# Patient Record
Sex: Female | Born: 1970 | Race: Black or African American | Hispanic: No | Marital: Single | Smoking: Never smoker
Health system: Southern US, Community
[De-identification: ages and names within clinical notes are randomized; demographics above are authoritative.]

## PROBLEM LIST (undated history)

## (undated) ENCOUNTER — Emergency Department (HOSPITAL_BASED_OUTPATIENT_CLINIC_OR_DEPARTMENT_OTHER): Admission: EM | Payer: Self-pay | Source: Home / Self Care

## (undated) DIAGNOSIS — J45909 Unspecified asthma, uncomplicated: Secondary | ICD-10-CM

## (undated) DIAGNOSIS — I10 Essential (primary) hypertension: Secondary | ICD-10-CM

## (undated) DIAGNOSIS — T7840XA Allergy, unspecified, initial encounter: Secondary | ICD-10-CM

## (undated) HISTORY — DX: Essential (primary) hypertension: I10

## (undated) HISTORY — DX: Unspecified asthma, uncomplicated: J45.909

## (undated) HISTORY — DX: Allergy, unspecified, initial encounter: T78.40XA

---

## 1998-12-08 ENCOUNTER — Ambulatory Visit (HOSPITAL_COMMUNITY): Admission: RE | Admit: 1998-12-08 | Discharge: 1998-12-08 | Payer: Self-pay | Admitting: Psychiatry

## 2001-06-16 ENCOUNTER — Other Ambulatory Visit: Admission: RE | Admit: 2001-06-16 | Discharge: 2001-06-16 | Payer: Self-pay | Admitting: *Deleted

## 2005-01-30 ENCOUNTER — Ambulatory Visit: Payer: Self-pay | Admitting: Internal Medicine

## 2012-07-31 ENCOUNTER — Ambulatory Visit: Payer: Managed Care, Other (non HMO)

## 2012-07-31 ENCOUNTER — Ambulatory Visit (INDEPENDENT_AMBULATORY_CARE_PROVIDER_SITE_OTHER): Payer: Managed Care, Other (non HMO) | Admitting: Emergency Medicine

## 2012-07-31 VITALS — BP 137/87 | HR 86 | Temp 98.4°F | Resp 16 | Ht 69.5 in | Wt 336.0 lb

## 2012-07-31 DIAGNOSIS — M79606 Pain in leg, unspecified: Secondary | ICD-10-CM

## 2012-07-31 DIAGNOSIS — S86919A Strain of unspecified muscle(s) and tendon(s) at lower leg level, unspecified leg, initial encounter: Secondary | ICD-10-CM

## 2012-07-31 DIAGNOSIS — M79609 Pain in unspecified limb: Secondary | ICD-10-CM

## 2012-07-31 MED ORDER — NAPROXEN SODIUM 550 MG PO TABS: 550.0000 mg | ORAL_TABLET | Freq: Two times a day (BID) | ORAL | Status: DC

## 2012-07-31 NOTE — Progress Notes (Signed)
  Date:  07/31/2012   Name:  ASHELYN MCCRAVY   DOB:  [birth year]   MRN:  [id number] Gender: female Age: 41 y.o.  PCP:  No primary provider on file.    Chief Complaint: Leg Pain   History of Present Illness:  Kathy Lane is a 41 y.o. pleasant patient who presents with the following:  Has pain in left lower leg after playing an x box game that required jumping up and down.  Has recently begun to work out.  Unsure of when her leg was injured.  Had pain for past two weeks.  No improvement with OTC meds.  Denies other complaint.  There is no problem list on file for this patient.   No past medical history on file.  No past surgical history on file.  History  Substance Use Topics  . Smoking status: Never Smoker   . Smokeless tobacco: Not on file  . Alcohol Use: Not on file    No family history on file.  Allergies  Allergen Reactions  . Penicillins     Medication list has been reviewed and updated.  Current Outpatient Prescriptions on File Prior to Visit  Medication Sig Dispense Refill  . aliskiren (TEKTURNA) 150 MG tablet Take 150 mg by mouth daily.      . montelukast (SINGULAIR) 10 MG tablet Take 10 mg by mouth at bedtime.        Review of Systems:  As per HPI, otherwise negative.    Physical Examination: Filed Vitals:   07/31/12 1810  BP: 137/87  Pulse: 86  Temp: 98.4 F (36.9 C)  Resp: 16   Filed Vitals:   07/31/12 1810  Height: 5' 9.5" (1.765 m)  Weight: 336 lb (152.409 kg)   Body mass index is 48.91 kg/(m^2). Ideal Body Weight: Weight in (lb) to have BMI = 25: 171.4    GEN: WDWN, NAD, Non-toxic, Alert & Oriented x 3 HEENT: Atraumatic, Normocephalic.  Ears and Nose: No external deformity. EXTR: No clubbing/cyanosis/edema.  No ecchymosis deformity.  Pain with forced hyper dorsiflexion of foot.  Not locally tender NEURO: Normal gait.  PSYCH: Normally interactive. Conversant. Not depressed or anxious appearing.  Calm demeanor.    Assessment and  Plan: Muscle strain lower leg Anaprox Follow up as needed  [facility] reading (PRIMARY) by  Dr. Dareen Piano. Negative .    Carmelina Dane, MD

## 2012-08-05 ENCOUNTER — Ambulatory Visit (INDEPENDENT_AMBULATORY_CARE_PROVIDER_SITE_OTHER): Payer: Managed Care, Other (non HMO) | Admitting: Physician Assistant

## 2012-08-05 ENCOUNTER — Telehealth: Payer: Self-pay

## 2012-08-05 VITALS — BP 138/92 | HR 74 | Temp 98.5°F | Resp 17 | Ht 69.5 in | Wt 336.0 lb

## 2012-08-05 DIAGNOSIS — H1012 Acute atopic conjunctivitis, left eye: Secondary | ICD-10-CM

## 2012-08-05 DIAGNOSIS — S76312A Strain of muscle, fascia and tendon of the posterior muscle group at thigh level, left thigh, initial encounter: Secondary | ICD-10-CM

## 2012-08-05 DIAGNOSIS — H1045 Other chronic allergic conjunctivitis: Secondary | ICD-10-CM

## 2012-08-05 DIAGNOSIS — IMO0002 Reserved for concepts with insufficient information to code with codable children: Secondary | ICD-10-CM

## 2012-08-05 DIAGNOSIS — J45909 Unspecified asthma, uncomplicated: Secondary | ICD-10-CM

## 2012-08-05 MED ORDER — OLOPATADINE HCL 0.1 % OP SOLN: 1.0000 [drp] | Freq: Two times a day (BID) | OPHTHALMIC | Status: DC

## 2012-08-05 MED ORDER — MONTELUKAST SODIUM 10 MG PO TABS: 10.0000 mg | ORAL_TABLET | Freq: Every day | ORAL | Status: DC

## 2012-08-05 MED ORDER — IBUPROFEN 800 MG PO TABS: 800.0000 mg | ORAL_TABLET | Freq: Three times a day (TID) | ORAL | Status: AC | PRN

## 2012-08-05 NOTE — Telephone Encounter (Signed)
PT STOPPED BY AT 102 - DID NOT WISH TO STAY - WOULD LIKE A CALL AS EARLY AS POSSIBLE. REGARDING PAIN MEDICATION NOT BEING STRONG ENOUGH

## 2012-08-05 NOTE — Telephone Encounter (Signed)
PT WAS SEEN IN OFFICE

## 2012-08-05 NOTE — Progress Notes (Signed)
  Subjective:    Patient ID: Kathy Lane, female    DOB: 14-May-1971, 41 y.o.   MRN: [id number]  HPI Pt presents to clinic for recheck.  She was seen 8/30 for L knee/leg pain which has not gotten better using naproxen.  She has been using motrin otc and that gives her more pain relief.  The pain is worse after she has been walking a few yards than when she first starts to walk.  Does not hurt in calf or knee joint.  She thinks she did this jumping with an xbox game. She has decreased her activity since her pain started.  She also needs refills on her singulair. Her asthma is well controlled on it.  Over the last couple of days she has been experiencing L eye itching and she feels like something is irritating her eye.  She has h/o seasonal allergies.   Review of Systems  Eyes: Positive for redness (mild L eye).  Musculoskeletal: Positive for gait problem. Negative for joint swelling and arthralgias.       Objective:   Physical Exam  Vitals reviewed. Constitutional: She is oriented to person, place, and time. She appears well-developed and well-nourished.  HENT:  Head: Normocephalic and atraumatic.  Right Ear: External ear normal.  Left Ear: External ear normal.  Nose: Nose normal.  Eyes: Pupils are equal, round, and reactive to light. Left eye exhibits no discharge. Left conjunctiva is injected (mild ).       Mild L upper eyelid swelling  Pulmonary/Chest: Effort normal.  Musculoskeletal:       Left knee: She exhibits normal range of motion and no swelling.       Legs:      Good knee strength without pain.  No swelling in popliteal fossa.  Neurological: She is alert and oriented to person, place, and time.  Skin: Skin is warm and dry.  Psychiatric: She has a normal mood and affect. Her behavior is normal. Judgment and thought content normal.          Assessment & Plan:   1. Left hamstring muscle strain  ibuprofen (ADVIL,MOTRIN) 800 MG tablet  2. Asthma  montelukast (SINGULAIR)  10 MG tablet  3. Allergic conjunctivitis of left eye  olopatadine (PATANOL) 0.1 % ophthalmic solution   Pt given hamstring strain exercises and illustration.  She is to use ice to the lateral attachment site. Her questions are answered.  If she develops any calf pain or swelling RTC.

## 2012-08-05 NOTE — Telephone Encounter (Signed)
Pt was seen in our office and she said she is still in pain and thought that the medication should have taken away her pain, would like a nurse to call her regarding this

## 2012-08-21 ENCOUNTER — Ambulatory Visit: Payer: Self-pay | Admitting: Family

## 2012-08-28 ENCOUNTER — Ambulatory Visit (INDEPENDENT_AMBULATORY_CARE_PROVIDER_SITE_OTHER): Payer: Self-pay | Admitting: Family

## 2012-08-28 ENCOUNTER — Encounter: Payer: Self-pay | Admitting: Family

## 2012-08-28 VITALS — BP 122/80 | HR 112 | Temp 98.0°F | Resp 16 | Wt 335.0 lb

## 2012-08-28 DIAGNOSIS — J309 Allergic rhinitis, unspecified: Secondary | ICD-10-CM

## 2012-08-28 DIAGNOSIS — I10 Essential (primary) hypertension: Secondary | ICD-10-CM

## 2012-08-28 DIAGNOSIS — J45909 Unspecified asthma, uncomplicated: Secondary | ICD-10-CM

## 2012-08-28 DIAGNOSIS — J302 Other seasonal allergic rhinitis: Secondary | ICD-10-CM

## 2012-08-28 MED ORDER — ALBUTEROL SULFATE HFA 108 (90 BASE) MCG/ACT IN AERS: 2.0000 | INHALATION_SPRAY | Freq: Four times a day (QID) | RESPIRATORY_TRACT | Status: AC | PRN

## 2012-08-28 MED ORDER — ALISKIREN FUMARATE 150 MG PO TABS: 150.0000 mg | ORAL_TABLET | Freq: Every day | ORAL | Status: DC

## 2012-08-28 NOTE — Patient Instructions (Addendum)
Please complete blood work prior to leaving. Schedule a fasting physical at your convenience. Welcome to [facility]!

## 2012-08-28 NOTE — Progress Notes (Signed)
Subjective:    Patient ID: Kathy Lane, female    DOB: 02/13/1971, 41 y.o.   MRN: [id number]  HPI  Ms.  Lane is a 41 yr old female who presents today to establish care.  Asthma-  Aggravated by cold temperature/change of weather.  She continues singulair.  Uses her inhaler about 2-3 times a month.  Hay/fever- allergies- uses singular year round.  Symptoms worst in the fall and in the spring.    HTN- uses tekturna daily. Reports that she is not sexually active.    Review of Systems  Constitutional: Negative for unexpected weight change.  HENT: Negative for hearing loss.   Eyes: Negative for visual disturbance.  Cardiovascular: Negative for chest pain and leg swelling.  Gastrointestinal: Negative for nausea, diarrhea, constipation and blood in stool.  Genitourinary: Negative for dysuria, frequency and menstrual problem.  Musculoskeletal: Negative for myalgias and arthralgias.  Neurological: Negative for headaches.  Hematological: Negative for adenopathy.  Psychiatric/Behavioral:       Denies depression/anxiety   No past medical history on file.  History   Social History  . Marital Status: Single    Spouse Name: N/A    Number of Children: N/A  . Years of Education: N/A   Occupational History  . Not on file.   Social History Main Topics  . Smoking status: Never Smoker   . Smokeless tobacco: Not on file  . Alcohol Use: Not on file  . Drug Use: Not on file  . Sexually Active: Not on file   Other Topics Concern  . 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  Family History  Problem Relation Age of Onset  . Hypertension Mother   . Hypertension Father     Allergies  Allergen Reactions  . Penicillins     Current Outpatient Prescriptions on File Prior to Visit  Medication Sig Dispense Refill  . albuterol (PROVENTIL HFA;VENTOLIN HFA) 108 (90  BASE) MCG/ACT inhaler Inhale 2 puffs into the lungs every 6 (six) hours as needed.  1 Inhaler  2  . aliskiren (TEKTURNA) 150 MG tablet Take 1 tablet (150 mg total) by mouth daily.  30 tablet  2  . montelukast (SINGULAIR) 10 MG tablet Take 1 tablet (10 mg total) by mouth at bedtime.  30 tablet  0    BP 122/80  Pulse 112  Temp 98 F (36.7 C) (Oral)  Resp 16  Wt 335 lb (151.955 kg)  SpO2 100%  LMP 07/12/2012       Objective:   Physical Exam  Constitutional: She is oriented to person, place, and time. She appears well-developed and well-nourished. No distress.       Morbidly obese AA female.   HENT:  Head: Normocephalic and atraumatic.  Cardiovascular: Normal rate and regular rhythm.   No murmur heard. Pulmonary/Chest: Effort normal and breath sounds normal. No respiratory distress. She has no wheezes. She has no rales. She exhibits no tenderness.  Abdominal: Soft. Bowel sounds are normal.  Musculoskeletal: She exhibits no edema.  Lymphadenopathy:    She has no cervical adenopathy.  Neurological: She is alert and oriented to person, place, and time.  Skin: Skin is warm and dry. No erythema.  Psychiatric: She has a normal mood and affect. Her behavior is normal. Judgment and thought content normal.          Assessment & Plan:

## 2012-08-29 LAB — BASIC METABOLIC PANEL WITH GFR
BUN: 11 mg/dL (ref 6–23)
CO2: 29 mEq/L (ref 19–32)
Calcium: 9 mg/dL (ref 8.4–10.5)
Chloride: 99 mEq/L (ref 96–112)
Creat: 0.91 mg/dL (ref 0.50–1.10)
GFR, Est African American: 89 mL/min
GFR, Est Non African American: 78 mL/min
Glucose, Bld: 132 mg/dL — ABNORMAL HIGH (ref 70–99)
Potassium: 3.7 mEq/L (ref 3.5–5.3)
Sodium: 138 mEq/L (ref 135–145)

## 2012-08-31 DIAGNOSIS — J302 Other seasonal allergic rhinitis: Secondary | ICD-10-CM | POA: Insufficient documentation

## 2012-08-31 NOTE — Assessment & Plan Note (Signed)
Currently stable on singulair and prn albuterol.

## 2012-08-31 NOTE — Assessment & Plan Note (Signed)
Advised pt- ok to take singulair or zyrtec as needed with singulair.  Symptoms currently stable.

## 2012-08-31 NOTE — Assessment & Plan Note (Signed)
BP is stable on tekturna.  Obtain BMET.  We discussed risk of birth defects should she become pregnant on tekturna and she verbalizes understanding.  She tells me that she is not sexually active.

## 2012-09-02 ENCOUNTER — Telehealth: Payer: Self-pay | Admitting: Family

## 2012-09-02 NOTE — Telephone Encounter (Signed)
Received medical records from [facility]  P: [phone] F: [phone]

## 2012-09-08 ENCOUNTER — Other Ambulatory Visit: Payer: Self-pay | Admitting: Physician Assistant

## 2012-09-09 ENCOUNTER — Other Ambulatory Visit: Payer: Self-pay | Admitting: *Deleted

## 2012-09-09 NOTE — Telephone Encounter (Signed)
Pt returned my call and left message that she is at work. Attempted to reach pt on work # and left message to return my call.

## 2012-09-09 NOTE — Telephone Encounter (Signed)
Received message from pt requesting refill of singulair to [facility] on [address]. Left message for pt to return my call. Also see lab note.

## 2012-09-10 MED ORDER — MONTELUKAST SODIUM 10 MG PO TABS: 10.0000 mg | ORAL_TABLET | Freq: Every day | ORAL | Status: AC

## 2012-09-10 NOTE — Telephone Encounter (Addendum)
Pt returned my call and left message to call her back. Refill sent to [facility].  Called [phone] and left message to call me back in the morning and ask to hold until I can get to the phone re: lab results.

## 2012-09-11 NOTE — Telephone Encounter (Signed)
Received message from pt stating I could just leave her a message re: rx and if appt is needed. Left message on [phone] at pt's request that rx had been refilled last night. Detailed lab info left and requested pt call me back to discuss need for additional blood test (hgb a1c).

## 2012-09-11 NOTE — Telephone Encounter (Signed)
Pt returned my call and was notified of rx completion and need to return for hgb a1c. Pt states she will call back to arrange appt / lab draw.

## 2012-09-30 ENCOUNTER — Telehealth: Payer: Self-pay | Admitting: *Deleted

## 2012-09-30 MED ORDER — ALISKIREN-HYDROCHLOROTHIAZIDE 150-25 MG PO TABS: 1.0000 | ORAL_TABLET | Freq: Every day | ORAL | Status: DC

## 2012-09-30 NOTE — Telephone Encounter (Signed)
Verified with pharmacy that pt has been getting tekturna HCT 150/25mg . Refill sent to [facility] x 2 refills. Left message on voicemail re: refill completion and to call if any questions. Per verbal from Provider, pt should schedule a fasting physical around January.  Please call pt to arrange appt.

## 2012-09-30 NOTE — Telephone Encounter (Signed)
Received message from pt stating she takes Tekturna HCT 150/25. Current med list indicates tekturna 150mg . Pt is requesting refill of tekturna hct.  Please advise.

## 2012-09-30 NOTE — Telephone Encounter (Signed)
Please verify with pharmacy. If last fill was Tekturna HCT ok to refill 150/25 #30 with 2 refills.

## 2012-10-01 NOTE — Telephone Encounter (Signed)
Left message for patient to return my call.

## 2012-10-06 NOTE — Telephone Encounter (Signed)
Left message for patient to return my call.

## 2012-10-08 NOTE — Telephone Encounter (Signed)
Left message for patient to return my call.

## 2012-10-08 NOTE — Telephone Encounter (Signed)
Letter mailed to pt.  

## 2012-10-28 ENCOUNTER — Other Ambulatory Visit: Payer: Self-pay | Admitting: *Deleted

## 2012-10-28 MED ORDER — ALISKIREN-HYDROCHLOROTHIAZIDE 150-25 MG PO TABS: 1.0000 | ORAL_TABLET | Freq: Every day | ORAL | Status: DC

## 2012-10-28 NOTE — Telephone Encounter (Signed)
Received fax from [facility] requesting a 90 day supply of Tekturna HCT as "retail refill limit exceeded". Rx sent.

## 2013-01-12 ENCOUNTER — Encounter: Payer: Self-pay | Admitting: Family

## 2013-01-12 ENCOUNTER — Ambulatory Visit (INDEPENDENT_AMBULATORY_CARE_PROVIDER_SITE_OTHER): Payer: Managed Care, Other (non HMO) | Admitting: Family

## 2013-01-12 ENCOUNTER — Ambulatory Visit: Payer: Self-pay | Admitting: Family

## 2013-01-12 VITALS — BP 130/78 | HR 81 | Temp 98.4°F | Resp 16 | Wt 323.1 lb

## 2013-01-12 DIAGNOSIS — J309 Allergic rhinitis, unspecified: Secondary | ICD-10-CM

## 2013-01-12 DIAGNOSIS — J302 Other seasonal allergic rhinitis: Secondary | ICD-10-CM

## 2013-01-12 DIAGNOSIS — Z0000 Encounter for general adult medical examination without abnormal findings: Secondary | ICD-10-CM

## 2013-01-12 DIAGNOSIS — J45909 Unspecified asthma, uncomplicated: Secondary | ICD-10-CM

## 2013-01-12 DIAGNOSIS — I10 Essential (primary) hypertension: Secondary | ICD-10-CM

## 2013-01-12 MED ORDER — ALISKIREN-HYDROCHLOROTHIAZIDE 150-25 MG PO TABS: 1.0000 | ORAL_TABLET | Freq: Every day | ORAL | Status: AC

## 2013-01-12 NOTE — Assessment & Plan Note (Signed)
Stable

## 2013-01-12 NOTE — Patient Instructions (Addendum)
Please complete lab work prior to leaving. Schedule a physical at the front dek.

## 2013-01-12 NOTE — Assessment & Plan Note (Signed)
Stable on singulair and albuterol.  

## 2013-01-12 NOTE — Progress Notes (Signed)
  Subjective:    Patient ID: Kathy Lane, female    DOB: 1970-12-13, 42 y.o.   MRN: [id number]  HPI  Asthma- She continues singulair.  Reports that she has been using albuterol 2-3 times a month.  Allergies- reports that her allergies are ok.   HTN- She continues tekturna hct. She denies sob, chest pain or swelling  Review of Systems See HPI  No past medical history on file.  History   Social History  . Marital Status: Single    Spouse Name: N/A    Number of Children: N/A  . Years of Education: N/A   Occupational History  . Not on file.   Social History Main Topics  . Smoking status: Never Smoker   . Smokeless tobacco: Not on file  . Alcohol Use: Not on file  . Drug Use: Not on file  . Sexually Active: Not on file   Other Topics Concern  . 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  Family History  Problem Relation Age of Onset  . Hypertension Mother   . Hypertension Father     Allergies  Allergen Reactions  . Penicillins     Current Outpatient Prescriptions on File Prior to Visit  Medication Sig Dispense Refill  . albuterol (PROVENTIL HFA;VENTOLIN HFA) 108 (90 BASE) MCG/ACT inhaler Inhale 2 puffs into the lungs every 6 (six) hours as needed.  1 Inhaler  2  . Aliskiren-Hydrochlorothiazide (TEKTURNA HCT) 150-25 MG TABS Take 1 tablet by mouth daily.  90 each  0  . montelukast (SINGULAIR) 10 MG tablet Take 1 tablet (10 mg total) by mouth at bedtime.  30 tablet  3   No current facility-administered medications on file prior to visit.    BP 140/84  Pulse 81  Temp(Src) 98.4 F (36.9 C) (Oral)  Resp 16  Wt 323 lb 1.9 oz (146.566 kg)  BMI 47.05 kg/m2  SpO2 98%  LMP 01/12/2013       Objective:   Physical Exam  Constitutional: She appears well-developed and well-nourished. No distress.  Cardiovascular: Normal  rate and regular rhythm.   No murmur heard. Pulmonary/Chest: Effort normal and breath sounds normal. No respiratory distress. She has no wheezes. She has no rales. She exhibits no tenderness.  Musculoskeletal: She exhibits no edema.  Psychiatric: She has a normal mood and affect. Her behavior is normal. Judgment and thought content normal.          Assessment & Plan:

## 2013-01-12 NOTE — Assessment & Plan Note (Signed)
BP stable on current meds.   

## 2013-01-13 ENCOUNTER — Encounter: Payer: Self-pay | Admitting: Family

## 2013-01-13 DIAGNOSIS — E785 Hyperlipidemia, unspecified: Secondary | ICD-10-CM | POA: Insufficient documentation

## 2013-01-13 LAB — LIPID PANEL
Cholesterol: 230 mg/dL — ABNORMAL HIGH (ref 0–200)
LDL Cholesterol: 158 mg/dL — ABNORMAL HIGH (ref 0–99)
VLDL: 11 mg/dL (ref 0–40)

## 2013-01-18 ENCOUNTER — Telehealth: Payer: Self-pay | Admitting: *Deleted

## 2013-01-18 NOTE — Telephone Encounter (Signed)
Wellness form completed and faxed to [facility] @ [phone].

## 2014-12-29 ENCOUNTER — Ambulatory Visit (INDEPENDENT_AMBULATORY_CARE_PROVIDER_SITE_OTHER): Payer: Managed Care, Other (non HMO) | Admitting: Sports Medicine

## 2014-12-29 VITALS — BP 146/87 | HR 89 | Temp 98.3°F | Resp 18 | Ht 69.0 in | Wt 326.0 lb

## 2014-12-29 DIAGNOSIS — M722 Plantar fascial fibromatosis: Secondary | ICD-10-CM

## 2014-12-29 DIAGNOSIS — M25561 Pain in right knee: Secondary | ICD-10-CM

## 2014-12-29 NOTE — Patient Instructions (Addendum)
It was nice to meet you today.. You can try over-the-counter gel heel cups. Remember to do the exercises as below as well as straight leg raises to help increase your quad strength.  willing to try

## 2014-12-29 NOTE — Progress Notes (Signed)
  Kathy Lane - 44 y.o. female MRN [id number]  Date of birth: 1971-06-04  SUBJECTIVE:   CC: right heel pain HPI: 2 months of worsening right heel pain worse first thing in the morning. She has had progressive difficulty with this. Denies any numbness or tingling. No known injury. Mild issues with this on left side in the past but self resolved.  ROS: per HPI.   HISTORY:  Past Medical, Surgical, Social, and Family History reviewed & updated per EMR.  Pertinent Historical Findings include:  reports that she has never smoked. She does not have any smokeless tobacco history on file. Obesity, asthma, hypertension Nonsmoker   OBJECTIVE:  VS:   HT:5\' 9"  (175.3 cm)   WT:(!) 326 lb (147.873 kg)  BMI:48.2          BP:(!) 146/87 mmHg  HR:89bpm  TEMP:98.3 F (36.8 C)(Oral)  RESP:100 %  PHYSICAL EXAM:  Adult obese African American female in no acute distress she is alert and appropriately interactive. Moderate insight. Lower extremity DP and PT pulses 2+/4. No significant pretibial edema. Bilateral foot exam: Overall normal-appearing, well aligned. No significant deformity. Marked tenderness palpation over lateral plantar fascia origin. No pain with calcaneal squeeze. No retrocalcaneal pain. No Achilles pain. Strength 5/5 in dorsiflexion, plantar flexion, inversion, eversion. Range of motion dorsiflexion to 90 on left, 80 on right. 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 Trace patellar grind. No significant effusion. Stable to varus and valgus strain anterior posterior drawer. Negative McMurray's. No medial or lateral joint line tenderness. Extensor mechanism intact.   ASSESSMENT: 1. Right knee pain   2. Plantar fasciitis, right    PLAN: See problem based charting & AVS for additional documentation. >50% of this 30 minute visit spent in direct patient counseling and/or coordination of care. Extensive time spent in discussing home exercise program including VMO  strengthening and eccentric heel raises. Alfredson exercises. Discussed using anti-inflammatories and potential injection for plantar fascial but patient is willing to try conservative treatment first. > Return if symptoms worsen or fail to improve.

## 2014-12-30 NOTE — Progress Notes (Deleted)
Foot Pain  Review of Systems Physical Exam

## 2015-05-11 ENCOUNTER — Telehealth: Payer: Self-pay

## 2015-05-11 NOTE — Telephone Encounter (Signed)
Refill Request.  

## 2015-12-17 ENCOUNTER — Ambulatory Visit (INDEPENDENT_AMBULATORY_CARE_PROVIDER_SITE_OTHER): Payer: Managed Care, Other (non HMO) | Admitting: Family Medicine

## 2015-12-17 VITALS — BP 140/80 | HR 103 | Temp 98.4°F | Resp 20 | Ht 70.0 in | Wt 335.0 lb

## 2015-12-17 DIAGNOSIS — J4521 Mild intermittent asthma with (acute) exacerbation: Secondary | ICD-10-CM | POA: Diagnosis not present

## 2015-12-17 DIAGNOSIS — J069 Acute upper respiratory infection, unspecified: Secondary | ICD-10-CM

## 2015-12-17 MED ORDER — MUCINEX DM MAXIMUM STRENGTH 60-1200 MG PO TB12: 1.0000 | ORAL_TABLET | Freq: Two times a day (BID) | ORAL | Status: AC

## 2015-12-17 MED ORDER — BECLOMETHASONE DIPROPIONATE 80 MCG/ACT IN AERS: 2.0000 | INHALATION_SPRAY | Freq: Two times a day (BID) | RESPIRATORY_TRACT | Status: AC

## 2015-12-17 MED ORDER — HYDROCOD POLST-CPM POLST ER 10-8 MG/5ML PO SUER: 5.0000 mL | Freq: Every evening | ORAL | Status: AC | PRN

## 2015-12-17 MED ORDER — IPRATROPIUM BROMIDE 0.03 % NA SOLN: 2.0000 | Freq: Four times a day (QID) | NASAL | Status: AC

## 2015-12-17 MED ORDER — FLUTICASONE PROPIONATE 50 MCG/ACT NA SUSP: 2.0000 | Freq: Every day | NASAL | Status: AC

## 2015-12-17 MED ORDER — BENZONATATE 200 MG PO CAPS: 200.0000 mg | ORAL_CAPSULE | Freq: Three times a day (TID) | ORAL | Status: AC | PRN

## 2015-12-17 NOTE — Patient Instructions (Addendum)
Step up your albuterol inhaler to 4 to 6 times a day while ill.  Add in some qvar twice a day during the illness to - hopefully we can avoid putting you on system steroids by using inhaled and nasal spray steroids instead. Add in nasal saline spray and or atrovent nasal spray during the day. Start mucinex DM twice a day.  If cough continues, then can try tessalon during the day or a teaspoon of the tussionex at night.   Asthma Attack Prevention While you may not be able to control the fact that you have asthma, you can take actions to prevent asthma attacks. The best way to prevent asthma attacks is to maintain good control of your asthma. You can achieve this by:  Taking your medicines as directed.  Avoiding things that can irritate your airways or make your asthma symptoms worse (asthma triggers).  Keeping track of how well your asthma is controlled and of any changes in your symptoms.  Responding quickly to worsening asthma symptoms (asthma attack).  Seeking emergency care when it is needed. WHAT ARE SOME WAYS TO PREVENT AN ASTHMA ATTACK? Have a Plan Work with your health care provider to create a written plan for managing and treating your asthma attacks (asthma action plan). This plan includes:  A list of your asthma triggers and how you can avoid them.  Information on when medicines should be taken and when their dosages should be changed.  The use of a device that measures how well your lungs are working (peak flow meter). Monitor Your Asthma Use your peak flow meter and record your results in a journal every day. A drop in your peak flow numbers on one or more days may indicate the start of an asthma attack. This can happen even before you start to feel symptoms. You can prevent an asthma attack from getting worse by following the steps in your asthma action plan. Avoid Asthma Triggers Work with your asthma health care provider to find out what your asthma triggers are. This can  be done by:  Allergy testing.  Keeping a journal that notes when asthma attacks occur and the factors that may have contributed to them.  Determining if there are other medical conditions that are making your asthma worse. Once you have determined your asthma triggers, take steps to avoid them. This may include avoiding excessive or prolonged exposure to:  Dust. Have someone dust and vacuum your home for you once or twice a week. Using a high-efficiency particulate arrestance (HEPA) vacuum is best.  Smoke. This includes campfire smoke, forest fire smoke, and secondhand smoke from tobacco products.  Pet dander. Avoid contact with animals that you know you are allergic to.  Allergens from trees, grasses or pollens. Avoid spending a lot of time outdoors when pollen counts are high, and on very windy days.  Very cold, dry, or humid air.  Mold.  Foods that contain high amounts of sulfites.  Strong odors.  Outdoor air pollutants, such as Museum/gallery exhibitions officer.  Indoor air pollutants, such as aerosol sprays and fumes from household cleaners.  Household pests, including dust mites and cockroaches, and pest droppings.  Certain medicines, including NSAIDs. Always talk to your health care provider before stopping or starting any new medicines. Medicines Take over-the-counter and prescription medicines only as told by your health care provider. Many asthma attacks can be prevented by carefully following your medicine schedule. Taking your medicines correctly is especially important when you cannot avoid certain asthma  triggers. Act Quickly If an asthma attack does happen, acting quickly can decrease how severe it is and how long it lasts. Take these steps:   Pay attention to your symptoms. If you are coughing, wheezing, or having difficulty breathing, do not wait to see if your symptoms go away on their own. Follow your asthma action plan.  If you have followed your asthma action plan and your  symptoms are not improving, call your health care provider or seek immediate medical care at the nearest hospital. It is important to note how often you need to use your fast-acting rescue inhaler. If you are using your rescue inhaler more often, it may mean that your asthma is not under control. Adjusting your asthma treatment plan may help you to prevent future asthma attacks and help you to gain better control of your condition. HOW CAN I PREVENT AN ASTHMA ATTACK WHEN I EXERCISE? Follow advice from your health care provider about whether you should use your fast-acting inhaler before exercising. Many people with asthma experience exercise-induced bronchoconstriction (EIB). This condition often worsens during vigorous exercise in cold, humid, or dry environments. Usually, people with EIB can stay very active by pre-treating with a fast-acting inhaler before exercising.   This information is not intended to replace advice given to you by your health care provider. Make sure you discuss any questions you have with your health care provider.   Document Released: 11/06/2009 Document Revised: 08/09/2015 Document Reviewed: 04/20/2015 Elsevier Interactive Patient Education [employer]2016 [employer] Inc.

## 2015-12-17 NOTE — Progress Notes (Addendum)
Subjective:  By signing my name below, I, Kathy Lane, attest that this documentation has been prepared under the direction and in the presence of Kathy SorensonEva Isham Smitherman, MD.  Electronically Signed: Andrew Auaven Lane, ED Scribe. 12/17/2015. 2:12 PM.   Patient ID: Kathy Lane, female    DOB: [birth year], 45 y.o.   MRN: [id number]  HPI Chief Complaint  Patient presents with  . Other    Chest congestion , x 4 days  . Cough  . Dizziness  . Shortness of Breath  . Chills   HPI Comments: Kathy Lane is a 45 y.o. female who presents to the [facility] complaining of cough and SOB. Pt receives weekly allergy shots, which sometime makes her feel ill afterwards. She reports associated chest congestion, wheezes,  chills, nasal congestion, sinus pressure. She has been using albuterol twice a day, zyrtec, flonase, Singulair. She has not been sleeping well due to symptoms. She denies fever and sore throat.  Past Medical History  Diagnosis Date  . Allergy   . Asthma   . Hypertension    History reviewed. No pertinent past surgical history. Prior to Admission medications   Medication Sig Start Date End Date Taking? Authorizing Provider  albuterol (PROVENTIL HFA;VENTOLIN HFA) 108 (90 BASE) MCG/ACT inhaler Inhale 2 puffs into the lungs every 6 (six) hours as needed. 08/28/12  Yes Sandford CrazeMelissa O'Sullivan, NP  Aliskiren-Hydrochlorothiazide (TEKTURNA HCT) 150-25 MG TABS Take 1 tablet by mouth daily. 01/12/13  Yes Sandford CrazeMelissa O'Sullivan, NP  cetirizine (ZYRTEC) 10 MG tablet Take 10 mg by mouth daily.   Yes Historical Provider, MD  montelukast (SINGULAIR) 10 MG tablet Take 1 tablet (10 mg total) by mouth at bedtime. 09/10/12  Yes Sandford CrazeMelissa O'Sullivan, NP   Review of Systems  Constitutional: Positive for chills. Negative for fever.  HENT: Positive for congestion and sinus pressure. Negative for sore throat.   Respiratory: Positive for cough, shortness of breath and wheezing.   Cardiovascular: Negative for chest  pain and palpitations.  Gastrointestinal: Negative for nausea and vomiting.  Genitourinary: Negative for urgency and decreased urine volume.  Psychiatric/Behavioral: Positive for sleep disturbance.    Objective:   Physical Exam  Constitutional: She is oriented to person, place, and time. She appears well-developed and well-nourished. No distress.  HENT:  Head: Normocephalic and atraumatic.  Nose: Mucosal edema present.  Mouth/Throat: Posterior oropharyngeal erythema present.  Eyes: Conjunctivae and EOM are normal.  Neck: Neck supple.  Cardiovascular: Normal rate.   Pulmonary/Chest: Effort normal.  Musculoskeletal: Normal range of motion.  Lymphadenopathy:       Head (right side): Tonsillar adenopathy present.       Head (left side): Tonsillar adenopathy present.    She has no cervical adenopathy.  Neurological: She is alert and oriented to person, place, and time.  Skin: Skin is warm and dry.  Psychiatric: She has a normal mood and affect. Her behavior is normal.  Nursing note and vitals reviewed.  Filed Vitals:   12/17/15 1404  BP: 140/80  Pulse: 103  Temp: 98.4 F (36.9 C)  TempSrc: Oral  Resp: 20  Height: 5\' 10"  (1.778 m)  Weight: 335 lb (151.955 kg)  SpO2: 97%    Assessment & Plan:   1. Acute upper respiratory infection   2. Hay fever with asthma, mild intermittent, with acute exacerbation   Would like to avoid using systemic steroids if possible - no signs of bacterial infection on exam and asthma flair is still mild so rec starting  qvar inh steroid in addition to flonase during illness. Due to medical co-morbidities, avoid otc decongestants - try atrovent instead.  Meds ordered this encounter  Medications  . fluticasone (FLONASE) 50 MCG/ACT nasal spray    Sig: Place 2 sprays into both nostrils at bedtime.    Dispense:  16 g    Refill:  0  . ipratropium (ATROVENT) 0.03 % nasal spray    Sig: Place 2 sprays into the nose 4 (four) times daily.    Dispense:  30 mL     Refill:  1  . beclomethasone (QVAR) 80 MCG/ACT inhaler    Sig: Inhale 2 puffs into the lungs 2 (two) times daily.    Dispense:  1 Inhaler    Refill:  0  . benzonatate (TESSALON) 200 MG capsule    Sig: Take 1 capsule (200 mg total) by mouth 3 (three) times daily as needed for cough.    Dispense:  40 capsule    Refill:  0  . chlorpheniramine-HYDROcodone (TUSSIONEX PENNKINETIC ER) 10-8 MG/5ML SUER    Sig: Take 5 mLs by mouth at bedtime as needed.    Dispense:  60 mL    Refill:  0  . Dextromethorphan-Guaifenesin (MUCINEX DM MAXIMUM STRENGTH) 60-1200 MG TB12    Sig: Take 1 tablet by mouth every 12 (twelve) hours.    Dispense:  14 each    Refill:  1   I personally performed the services described in this documentation, which was scribed in my presence. The recorded information has been reviewed and considered, and addended by me as needed.  Kathy Sorenson, MD MPH

## 2016-03-12 ENCOUNTER — Ambulatory Visit (INDEPENDENT_AMBULATORY_CARE_PROVIDER_SITE_OTHER): Payer: Managed Care, Other (non HMO)

## 2016-03-12 ENCOUNTER — Ambulatory Visit (INDEPENDENT_AMBULATORY_CARE_PROVIDER_SITE_OTHER): Payer: Managed Care, Other (non HMO) | Admitting: Family Medicine

## 2016-03-12 VITALS — BP 138/92 | HR 90 | Temp 98.1°F | Resp 18 | Ht 67.0 in | Wt 330.0 lb

## 2016-03-12 DIAGNOSIS — M79671 Pain in right foot: Secondary | ICD-10-CM | POA: Diagnosis not present

## 2016-03-12 DIAGNOSIS — M6588 Other synovitis and tenosynovitis, other site: Secondary | ICD-10-CM | POA: Diagnosis not present

## 2016-03-12 DIAGNOSIS — M7750 Other enthesopathy of unspecified foot: Secondary | ICD-10-CM

## 2016-03-12 MED ORDER — DICLOFENAC SODIUM 75 MG PO TBEC: 75.0000 mg | DELAYED_RELEASE_TABLET | Freq: Two times a day (BID) | ORAL | Status: AC

## 2016-03-12 NOTE — Patient Instructions (Addendum)
Take diclofenac one pill twice daily at breakfast and supper  Try to avoid stressing the foot and ankle, avoiding prolonged standing or walking. Recommend comfortable sneakers and not wearing high heels.  Apply ice for about 15 minutes for 4- 5 times daily  If not improving significantly over the next week contact us and referral will be made to sports medicine for further evaluation    IF you received an x-ray today, you will receive an invoice from [facility]. Please contact [facility] at [phone] with questions or concerns regarding your invoice.   IF you received labwork today, you will receive an invoice from [facility]. Please contact [facility] at [phone] with questions or concerns regarding your invoice.   Our billing staff will not be able to assist you with questions regarding bills from these companies.  You will be contacted with the lab results as soon as they are available. The fastest way to get your results is to activate your My Chart account. Instructions are located on the last page of this paperwork. If you have not heard from us regarding the results in 2 weeks, please contact this office.

## 2016-03-12 NOTE — Progress Notes (Signed)
Patient ID: Kathy Lane, female    DOB: 09-06-1971  Age: 45 y.o. MRN: [id number]  Chief Complaint  Patient presents with  . Foot Pain    Right    Subjective:   45 year old lady who had done some work in her patio digging and putting some rocks in. She developed pain in the right foot, dorsum of the foot. No specific point in time injury but it hurts there. It hurts more when she is on it. When she is off it doesn't seem to hurt that much. Has not had this same problem in the past. She has had plantar fasciitis in the past.  Current allergies, medications, problem list, past/family and social histories reviewed.  Objective:  BP 138/92 mmHg  Pulse 90  Temp(Src) 98.1 F (36.7 C) (Oral)  Resp 18  Ht  (1.702 m)  Wt 330 lb (149.687 kg)  BMI 51.67 kg/m2  SpO2 98%  LMP 01/31/2016  No major acute distress. She is overweight. No significant edema. Good range of motion of the ankle. With inversion of the ankle there is pain laterally in the proximal part of the foot. She is tender to touch anterior to the lateral malleolus and down toward the calcaneus.  Assessment & Plan:   Assessment: 1. Foot pain, right   2. Tendonitis of ankle or foot       Plan: Will x-ray  Orders Placed This Encounter  Procedures  . DG Foot Complete Right    Order Specific Question:  Reason for Exam (SYMPTOM  OR DIAGNOSIS REQUIRED)    Answer:  foot pain lateral proximal area    Order Specific Question:  Is the patient pregnant?    Answer:  No    Order Specific Question:  Preferred imaging location?    Answer:  External    Meds ordered this encounter  Medications  . diclofenac (VOLTAREN) 75 MG EC tablet    Sig: Take 1 tablet (75 mg total) by mouth 2 (two) times daily.    Dispense:  30 tablet    Refill:  0   X-rays examined. No fracture noted. Radiology result is still pending.  Discussed long term need to watch her weight because of stress on feet.    Patient Instructions   Take  diclofenac one pill twice daily at breakfast and supper  Try to avoid stressing the foot and ankle, avoiding prolonged standing or walking. Recommend comfortable sneakers and not wearing high heels.  Apply ice for about 15 minutes for 4- 5 times daily  If not improving significantly over the next week contact us and referral will be made to sports medicine for further evaluation    IF you received an x-ray today, you will receive an invoice from [facility]. Please contact [facility] at [phone] with questions or concerns regarding your invoice.   IF you received labwork today, you will receive an invoice from [facility]. Please contact [facility] at [phone] with questions or concerns regarding your invoice.   Our billing staff will not be able to assist you with questions regarding bills from these companies.  You will be contacted with the lab results as soon as they are available. The fastest way to get your results is to activate your My Chart account. Instructions are located on the last page of this paperwork. If you have not heard from [place] regarding the results in 2 weeks, please contact this office.         No  Follow-up on file.   HOPPER,DAVID, MD 03/12/2016

## 2017-01-04 IMAGING — CR DG FOOT COMPLETE 3+V*R*
3 series · 3 of 3 positions shown · non-contrast
Comparison: None.

CLINICAL DATA: Right foot pain, no known injury

EXAM:
RIGHT FOOT COMPLETE - 3+ VIEW

[AP]
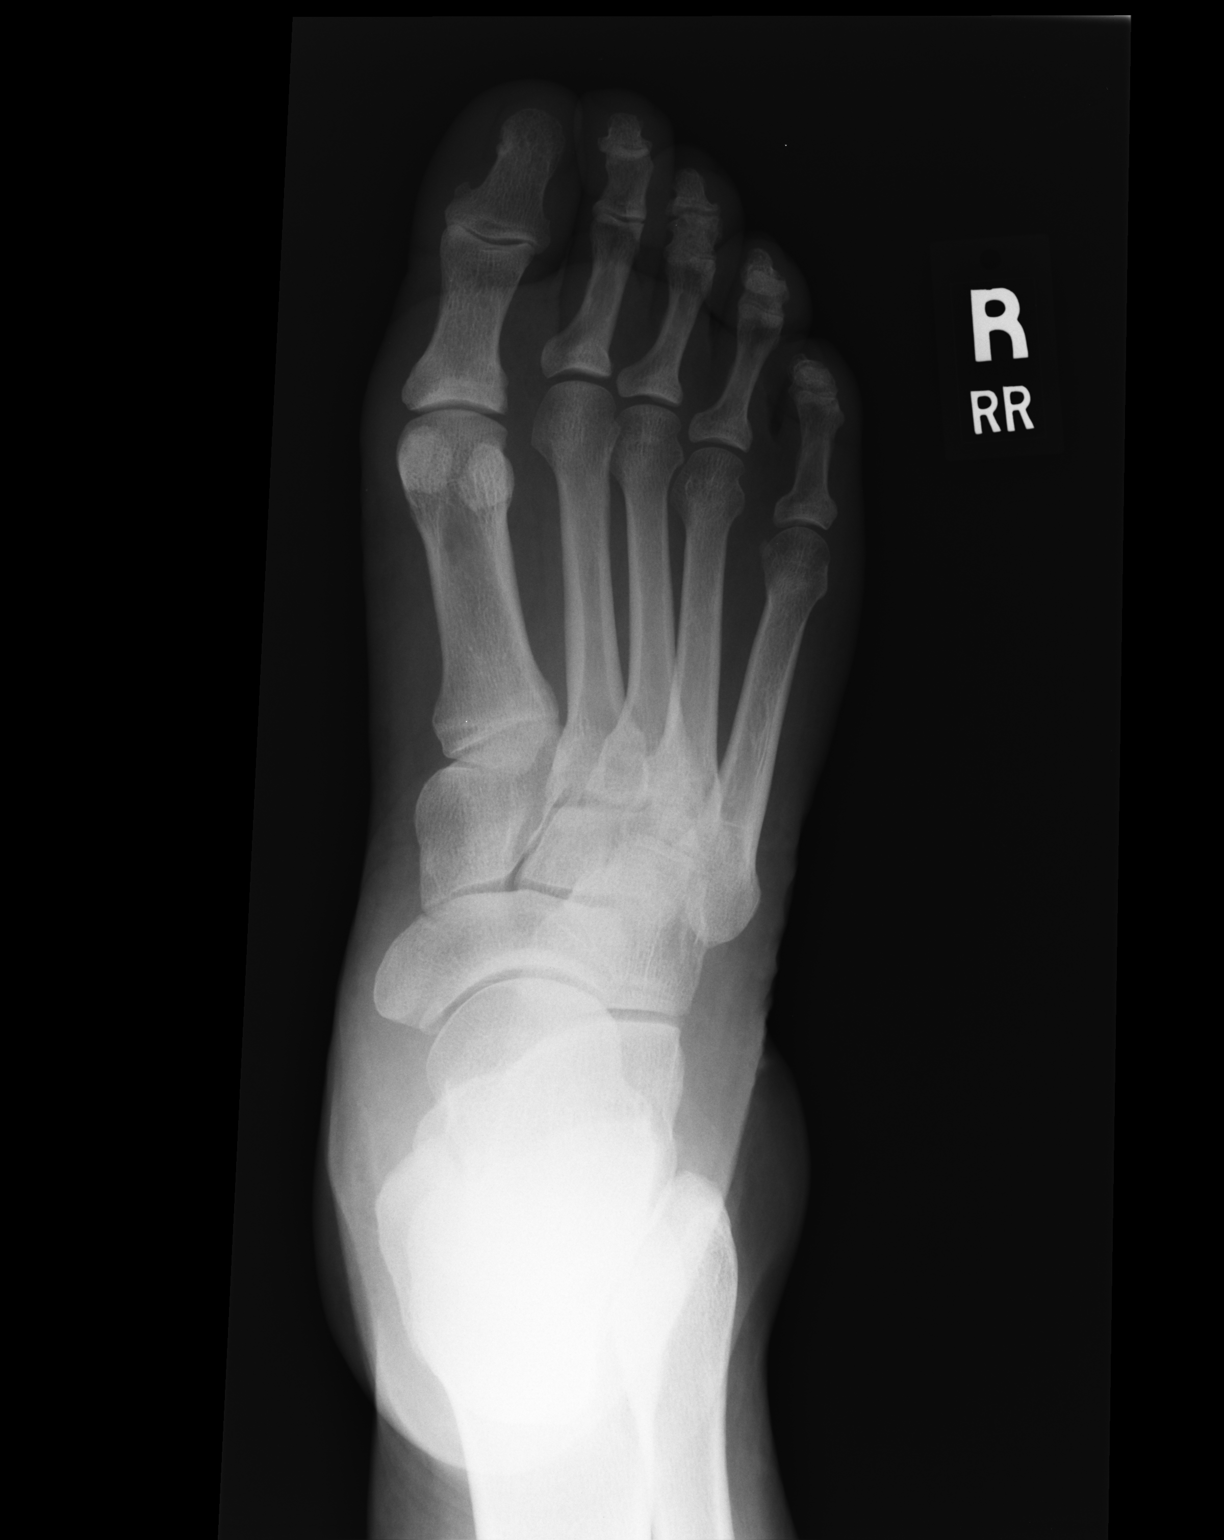

[ap obl int rot]
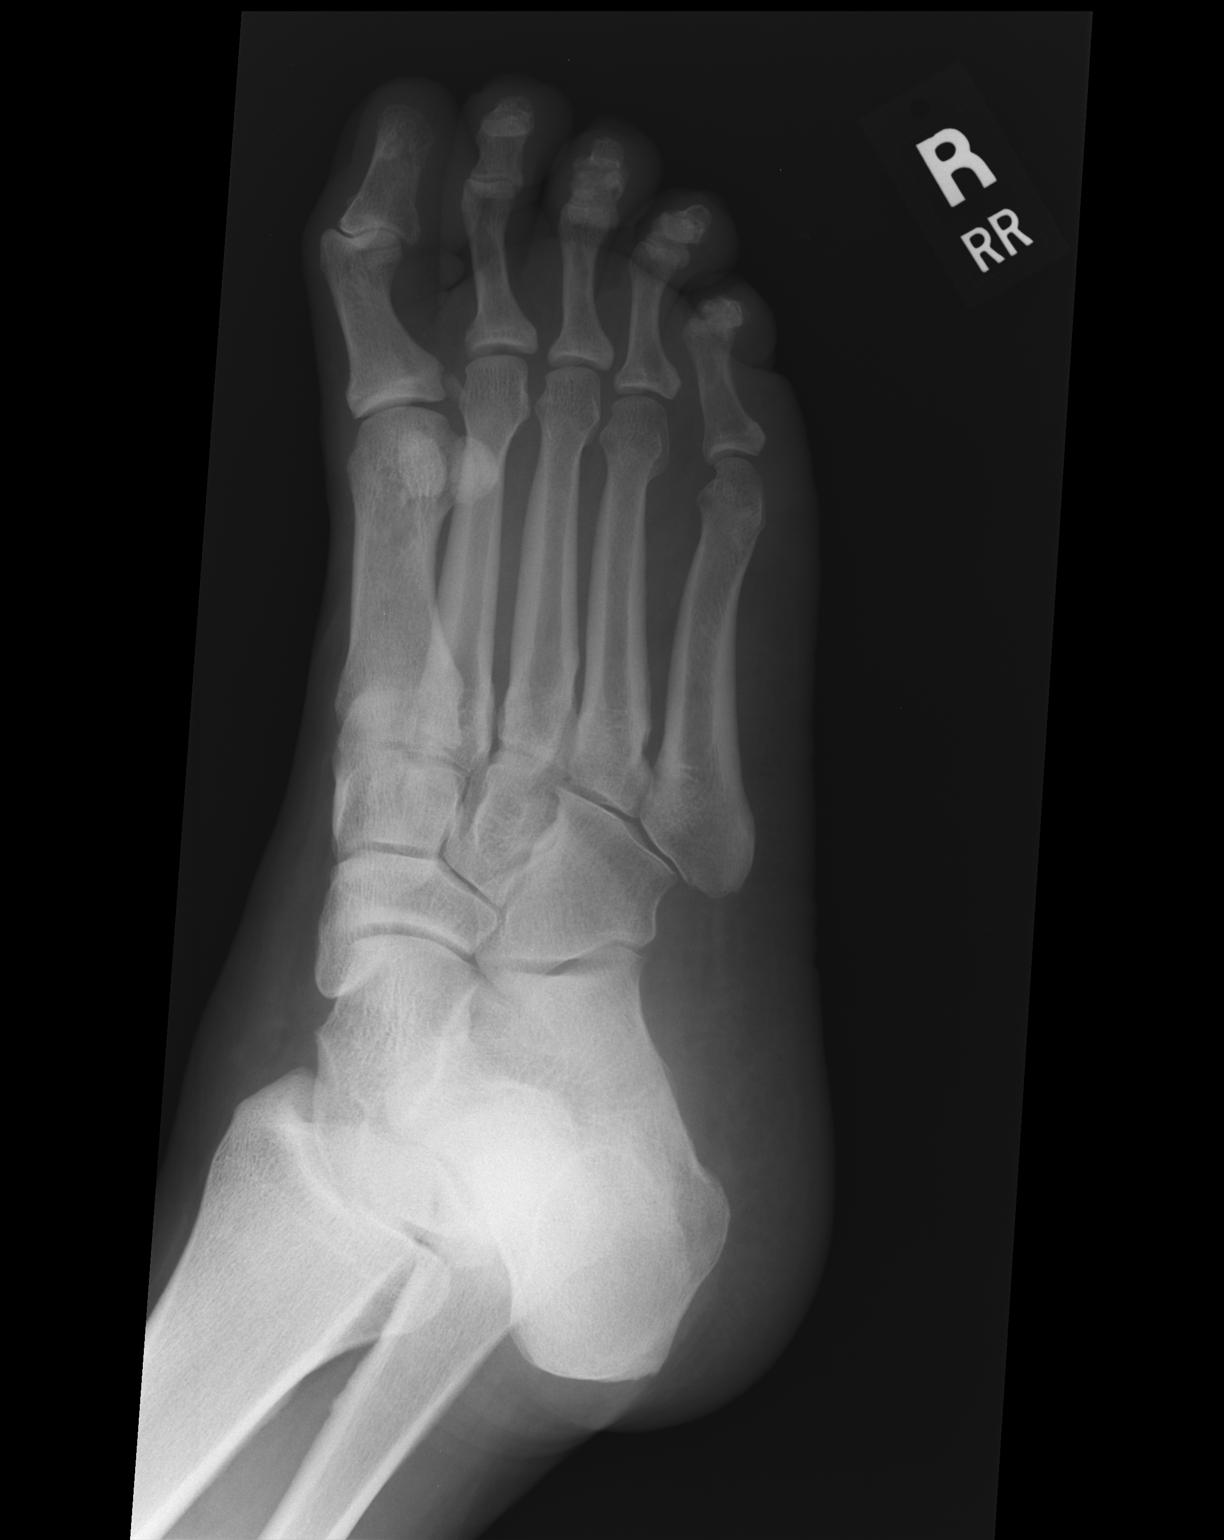

[lateral]
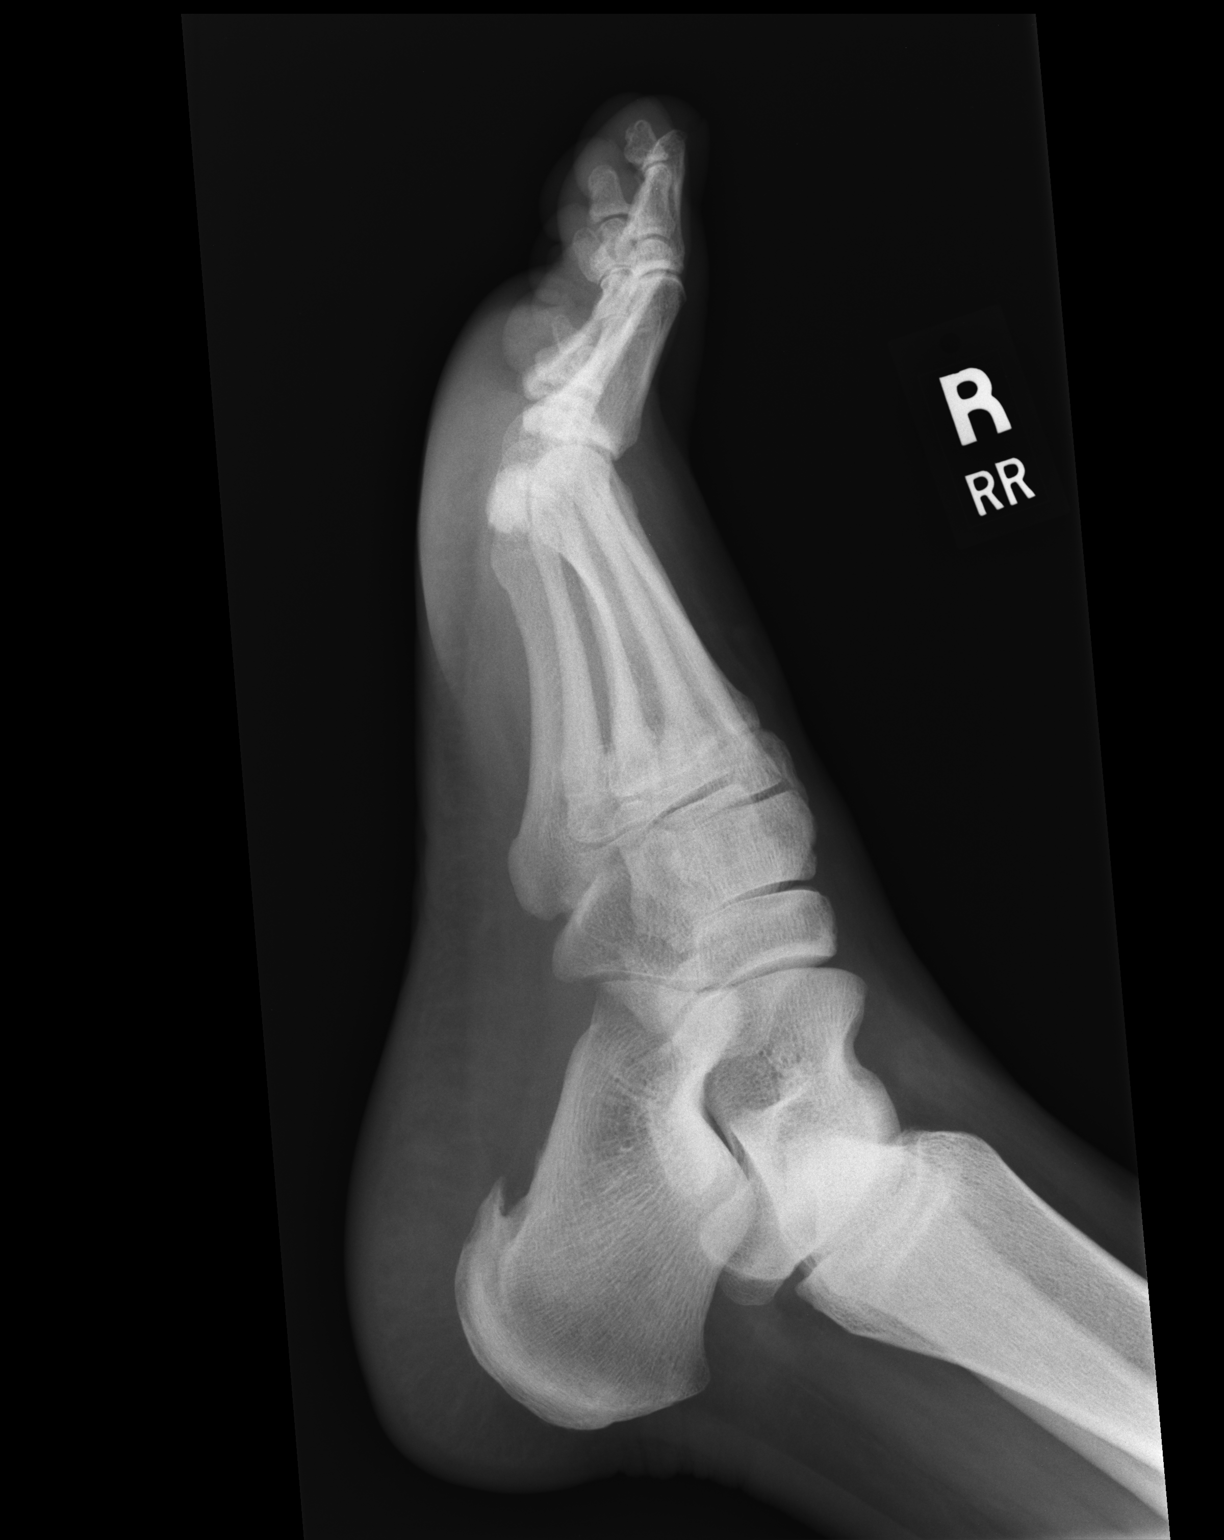

[3 of 3 positions shown; findings below may reference images not displayed]

FINDINGS: Three views of the right foot submitted. No acute fracture or
subluxation. There is plantar spur of calcaneus.
IMPRESSION: No acute fracture or subluxation.  Plantar spur of calcaneus.

## 2020-01-28 ENCOUNTER — Ambulatory Visit: Payer: Managed Care, Other (non HMO) | Attending: Internal Medicine

## 2020-01-28 DIAGNOSIS — Z23 Encounter for immunization: Secondary | ICD-10-CM

## 2020-01-28 NOTE — Progress Notes (Signed)
   Covid-19 Vaccination Clinic  Name:  Kathy Lane    MRN: [id number] DOB: 1971/02/09  01/28/2020  Kathy Lane was observed post Covid-19 immunization for 15 minutes without incidence. She was provided with Vaccine Information Sheet and instruction to access the V-Safe system.   Kathy Lane was instructed to call 911 with any severe reactions post vaccine: Marland Kitchen Difficulty breathing  . Swelling of your face and throat  . A fast heartbeat  . A bad rash all over your body  . Dizziness and weakness    Immunizations Administered    Name Date Dose VIS Date Route   Pfizer COVID-19 Vaccine 01/28/2020 10:10 AM 0.3 mL 11/12/2019 Intramuscular   Manufacturer: [employer], [employer]   Lot: [id number]   NDC: [address]-1

## 2020-02-23 ENCOUNTER — Ambulatory Visit: Payer: Managed Care, Other (non HMO) | Attending: Internal Medicine

## 2020-02-23 DIAGNOSIS — Z23 Encounter for immunization: Secondary | ICD-10-CM

## 2020-02-23 NOTE — Progress Notes (Signed)
   Covid-19 Vaccination Clinic  Name:  VERENISE MOULIN    MRN: [id number] DOB: [birth year]  02/23/2020  Ms. Burkman was observed post Covid-19 immunization for 15 minutes without incident. She was provided with Vaccine Information Sheet and instruction to access the V-Safe system.   Ms. Berch was instructed to call 911 with any severe reactions post vaccine: Marland Kitchen Difficulty breathing  . Swelling of face and throat  . A fast heartbeat  . A bad rash all over body  . Dizziness and weakness   Immunizations Administered    Name Date Dose VIS Date Route   Pfizer COVID-19 Vaccine 02/23/2020  4:00 PM 0.3 mL 11/12/2019 Intramuscular   Manufacturer: [employer], [employer]   Lot: [id number]   NDC: [address]-7

## 2022-07-11 ENCOUNTER — Ambulatory Visit: Payer: BC Managed Care – PPO | Admitting: Orthopedic Surgery

## 2022-07-11 ENCOUNTER — Encounter: Payer: Self-pay | Admitting: Orthopedic Surgery

## 2022-07-11 ENCOUNTER — Ambulatory Visit (INDEPENDENT_AMBULATORY_CARE_PROVIDER_SITE_OTHER): Payer: BC Managed Care – PPO

## 2022-07-11 DIAGNOSIS — M79672 Pain in left foot: Secondary | ICD-10-CM

## 2022-07-11 DIAGNOSIS — M25561 Pain in right knee: Secondary | ICD-10-CM | POA: Diagnosis not present

## 2022-07-11 NOTE — Progress Notes (Signed)
Office Visit Note   Patient: Kathy Lane           Date of Birth: 1971/08/16           MRN: [id number] Visit Date: 07/11/2022              Requested by: No referring provider defined for this encounter. PCP: Patient, No Pcp Per  Chief Complaint  Patient presents with   Left Foot - Pain      HPI: Patient is a 51 year old woman who presents complaining of bunion pain left foot.  Patient states she has also had some right knee pain as well.  Assessment & Plan: Visit Diagnoses:  1. Pain in left foot   2. Acute pain of right knee     Plan: Recommended a larger shoe size to accommodate the width and length of her toes.  Patient has no bunion deformity or pain.  The callus on the second toe PIP joint should resolve with proper shoe wear.  Follow-Up Instructions: Return if symptoms worsen or fail to improve.   Ortho Exam  Patient is alert, oriented, no adenopathy, well-dressed, normal affect, normal respiratory effort. Examination patient has flexible clawing of the second and third toes.  She has callus over the PIP joint of the second toe there is no redness swelling no ulcers.  She has a good dorsalis pedis pulse she has a long second and third metatarsal head but they are not tender to palpation there is no plantar calluses.  She has dorsiflexion to neutral.  There is no pain with range of motion of the great toe MTP joint no redness or swelling no evidence of gout no evidence of a bunion deformity.  Patient's shoe size is 10 her foot is wider and longer than the orthotics from her shoe.  Imaging: XR Foot Complete Left  Result Date: 07/11/2022 2 view radiographs of the left foot shows no bunion deformity patient does have a long second and third metatarsal.  No images are attached to the encounter.  Labs: No results found for: "HGBA1C", "ESRSEDRATE", "CRP", "LABURIC", "REPTSTATUS", "GRAMSTAIN", "CULT", "LABORGA"   No results found for: "ALBUMIN", "PREALBUMIN",  "CBC"  No results found for: "MG" No results found for: "VD25OH"  No results found for: "PREALBUMIN"     No data to display           There is no height or weight on file to calculate BMI.  Orders:  Orders Placed This Encounter  Procedures   XR Foot Complete Left   No orders of the defined types were placed in this encounter.    Procedures: No procedures performed  Clinical Data: No additional findings.  ROS:  All other systems negative, except as noted in the HPI. Review of Systems  Objective: Vital Signs: There were no vitals taken for this visit.  Specialty Comments:  No specialty comments available.  PMFS History: Patient Active Problem List   Diagnosis Date Noted   Other and unspecified hyperlipidemia 01/13/2013   Seasonal allergies 08/31/2012   HTN (hypertension) 08/28/2012   Asthma 08/05/2012   Past Medical History:  Diagnosis Date   Allergy    Asthma    Hypertension     Family History  Problem Relation Age of Onset   Hypertension Mother    Hypertension Father     History reviewed. No pertinent surgical history. Social History   Occupational History   Not on file  Tobacco Use   Smoking status: Never  Smokeless tobacco: Not on file  Substance and Sexual Activity   Alcohol use: Not on file   Drug use: Not on file   Sexual activity: Not on file
